# Patient Record
Sex: Female | Born: 1995 | Race: White | Hispanic: No | Marital: Single | Smoking: Never smoker
Health system: Southern US, Community
[De-identification: ages and names within clinical notes are randomized; demographics above are authoritative.]

---

## 2013-01-25 ENCOUNTER — Emergency Department (HOSPITAL_COMMUNITY): Payer: Medicaid Other

## 2013-01-25 ENCOUNTER — Encounter (HOSPITAL_COMMUNITY): Payer: Self-pay | Admitting: Emergency Medicine

## 2013-01-25 ENCOUNTER — Emergency Department (HOSPITAL_COMMUNITY)
Admission: EM | Admit: 2013-01-25 | Discharge: 2013-01-25 | Disposition: A | Discharge status: Home / Self Care | Payer: Medicaid Other | Attending: Emergency Medicine | Admitting: Emergency Medicine

## 2013-01-25 DIAGNOSIS — R42 Dizziness and giddiness: Secondary | ICD-10-CM | POA: Insufficient documentation

## 2013-01-25 DIAGNOSIS — W219XXA Striking against or struck by unspecified sports equipment, initial encounter: Secondary | ICD-10-CM | POA: Insufficient documentation

## 2013-01-25 DIAGNOSIS — Y92838 Other recreation area as the place of occurrence of the external cause: Secondary | ICD-10-CM | POA: Insufficient documentation

## 2013-01-25 DIAGNOSIS — S0510XA Contusion of eyeball and orbital tissues, unspecified eye, initial encounter: Secondary | ICD-10-CM | POA: Insufficient documentation

## 2013-01-25 DIAGNOSIS — Y9364 Activity, baseball: Secondary | ICD-10-CM | POA: Insufficient documentation

## 2013-01-25 DIAGNOSIS — S060X0A Concussion without loss of consciousness, initial encounter: Secondary | ICD-10-CM | POA: Insufficient documentation

## 2013-01-25 DIAGNOSIS — S0512XA Contusion of eyeball and orbital tissues, left eye, initial encounter: Secondary | ICD-10-CM

## 2013-01-25 DIAGNOSIS — S060X9A Concussion with loss of consciousness of unspecified duration, initial encounter: Secondary | ICD-10-CM

## 2013-01-25 DIAGNOSIS — Y9239 Other specified sports and athletic area as the place of occurrence of the external cause: Secondary | ICD-10-CM | POA: Insufficient documentation

## 2013-01-25 MED ORDER — TRAMADOL HCL 50 MG PO TABS
50.0000 mg | ORAL_TABLET | Freq: Once | ORAL | Status: AC
  Administered 2013-01-25: 50 mg via ORAL
  Filled 2013-01-25: qty 1

## 2013-01-25 MED ORDER — TRAMADOL HCL 50 MG PO TABS: 50.0000 mg | ORAL_TABLET | Freq: Four times a day (QID) | ORAL | Status: DC | PRN

## 2013-01-25 NOTE — ED Notes (Signed)
Sclera is white in appearance. Slight edema below eye. Eyelids are bruised.

## 2013-01-25 NOTE — ED Notes (Signed)
Pt states while at batting practice last night was hit by softball from another player bat. Swelling and bruising noted to L eye. Pt states vision cleared @ 1700 today, denies n/v. C/o HA

## 2013-01-25 NOTE — ED Provider Notes (Signed)
History    This chart was scribed for non-physician practitioner working with Maria Munch, MD by Burman Nieves. This patient was seen in room WTR8/WTR8 and the patient's care was started at 10:22 PM.   CSN: [id number]  Arrival date & time 01/25/13  2011  Chief Complaint  Patient presents with  . Eye Injury    (Consider location/radiation/quality/duration/timing/severity/associated sxs/prior treatment) Patient is a 17 y.o. female presenting with eye pain. The history is provided by the patient. No language interpreter was used.  Eye Pain This is a new problem. The current episode started yesterday. The problem occurs constantly. The problem has not changed since onset.Associated symptoms include headaches. Pertinent negatives include no shortness of breath. Nothing aggravates the symptoms. Nothing relieves the symptoms.   Maria Clements is a 17 y.o. female who presents to the Emergency Department complaining of moderate constant left eye pain onset yesterday evening. She got hit in left eye by softball that was traveling aprox 50 mph off of another players bat. Pt states vision is blurry accompanied with dizziness and headache.  Pt denies of fever, chills, cough, nausea, vomiting, diarrhea, SOB, weakness, and any other associated symptoms. She has taken Ibuprofen 800 mg for the pain, but does not feel that it helped.  She has also applied ice to the area, which has helped with the swelling somewhat.   History reviewed. No pertinent past medical history.  History reviewed. No pertinent past surgical history.  No family history on file.  History  Substance Use Topics  . Smoking status: Never Smoker   . Smokeless tobacco: Not on file  . Alcohol Use: No    OB History   Grav Para Term Preterm Abortions TAB SAB Ect Mult Living                  Review of Systems  Constitutional: Negative for fever and chills.  Eyes: Positive for pain and visual disturbance. Negative for photophobia.   Respiratory: Negative for shortness of breath.   Gastrointestinal: Negative for nausea and vomiting.  Neurological: Positive for dizziness and headaches. Negative for weakness.  All other systems reviewed and are negative.    Allergies  Ceclor  Home Medications   Current Outpatient Rx  Name  Route  Sig  Dispense  Refill  . ibuprofen (ADVIL,MOTRIN) 200 MG tablet   Oral   Take 800 mg by mouth every 6 (six) hours as needed for pain (for pain).           BP 92/74  Pulse 62  Temp(Src) 98 F (36.7 C) (Oral)  Resp 16  Ht 5\' 6"  (1.676 m)  Wt 165 lb (74.844 kg)  BMI 26.64 kg/m2  SpO2 100%  LMP 12/25/2012  Physical Exam  Nursing note and vitals reviewed. Constitutional: She is oriented to person, place, and time. She appears well-developed and well-nourished. No distress.  HENT:  Head: Normocephalic and atraumatic.  Eyes: Conjunctivae and EOM are normal. Pupils are equal, round, and reactive to light.  Neck: Neck supple. No tracheal deviation present.  Cardiovascular: Normal rate, regular rhythm and normal heart sounds.   Pulmonary/Chest: Effort normal and breath sounds normal. No respiratory distress.  Musculoskeletal: Normal range of motion. She exhibits no tenderness.  Neurological: She is alert and oriented to person, place, and time. She has normal strength. No cranial nerve deficit or sensory deficit. Coordination normal.  Skin: Skin is warm and dry.  Psychiatric: She has a normal mood and affect. Her behavior is normal.  ED Course  Procedures (including critical care time) DIAGNOSTIC STUDIES: Oxygen Saturation is 100% on room air, normal by my interpretation.    COORDINATION OF CARE:  10:28 PM Discussed ED treatment with pt and pt agrees.     Labs Reviewed - No data to display Ct Maxillofacial Wo Cm  01/25/2013  *RADIOLOGY REPORT*  Clinical Data: Left eye injury  CT MAXILLOFACIAL WITHOUT CONTRAST  Technique:  Multidetector CT imaging of the maxillofacial  structures was performed. Multiplanar CT image reconstructions were also generated.  Comparison: None.  Findings: No evidence of maxillofacial fracture.  Mild soft tissue swelling overlying the left lateral orbit/zygoma (series 3/image 56).  The bilateral orbits, including the globes and retroconal soft tissues, are within normal limits.  Partial opacification of the left maxillary sinuses.  The visualized paranasal sinuses and mastoid air cells are otherwise clear.  The visualized brain parenchyma is unremarkable.  The cervical spine is normal to C6.  IMPRESSION: Mild soft tissue swelling overlying the left lateral orbit/zygoma.  Underlying left lobe and retroconal soft tissues are within normal limits.  No evidence of maxillofacial fracture.   Original Report Authenticated By: Charline Bills, M.D.      No diagnosis found.    MDM  Patient hit in the eye with a softball last evening.  No LOC.  No vomiting.  She reports that vision is slightly blurred, but normal visual acuity.  Normal EOM.  CT maxillofacial negative for fracture or injury to the orbit or globe.  Patient instructed to follow up with her Athletic Trainer at school for concussion testing.    I personally performed the services described in this documentation, which was scribed in my presence. The recorded information has been reviewed and is accurate.     Pascal Lux [place], PA-C 01/26/13 1055

## 2013-01-26 NOTE — ED Provider Notes (Signed)
  Medical screening examination/treatment/procedure(s) were performed by non-physician practitioner and as supervising physician I was immediately available for consultation/collaboration.    Aaradhya Kysar, MD 01/26/13 2121 

## 2014-04-26 IMAGING — CT CT MAXILLOFACIAL W/O CM
1 series · 16 of 30 positions shown, 20 images · non-contrast
Comparison: None.

CLINICAL DATA: Left eye injury

CT MAXILLOFACIAL WITHOUT CONTRAST
TECHNIQUE: Multidetector CT imaging of the maxillofacial
structures was performed. Multiplanar CT image reconstructions were
also generated.

[Series 3: facial st · axial · 0.32mm/px · z∈[+588,+716]mm · 16 of 70 slices shown, 20 images]
[im 3/70  brain]
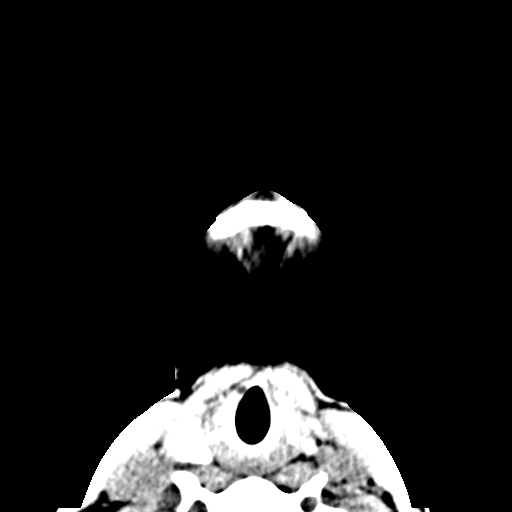
[im 3/70  bone]
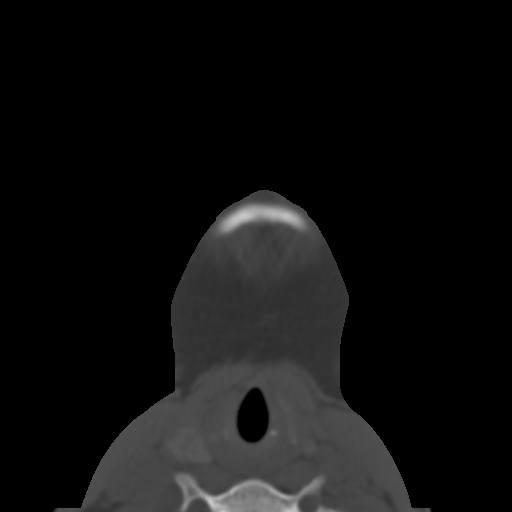
[im 8/70  bone]
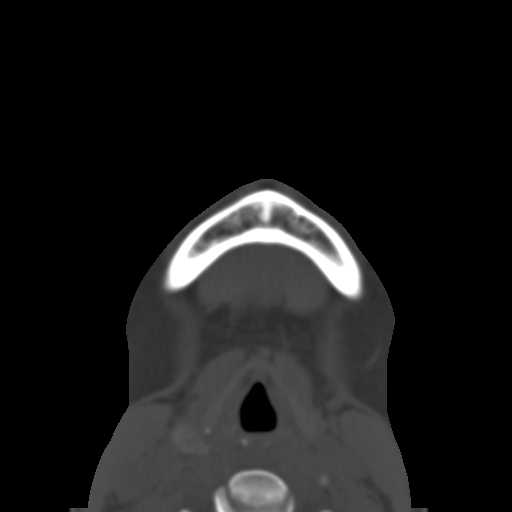
[im 12/70  bone]
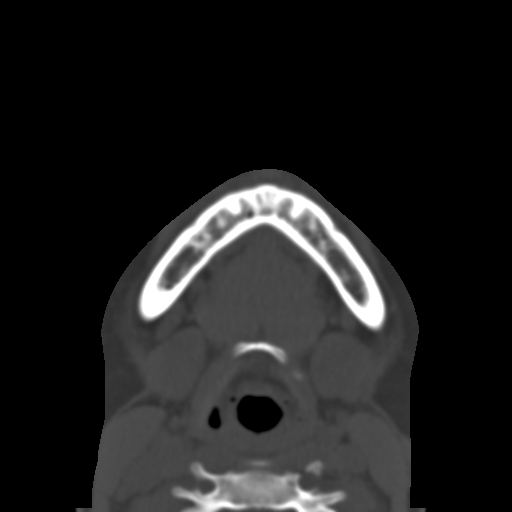
[im 17/70  bone]
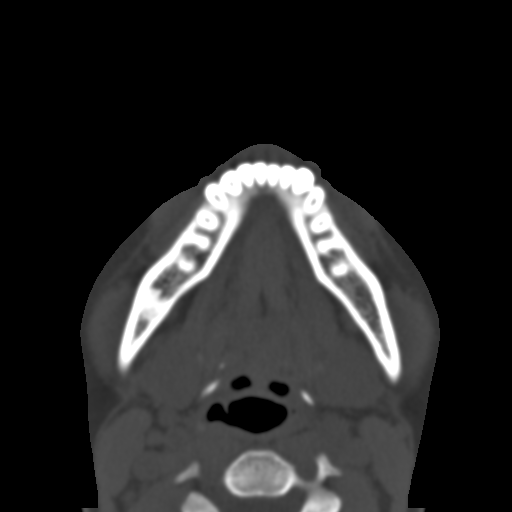
[im 20/70  brain]
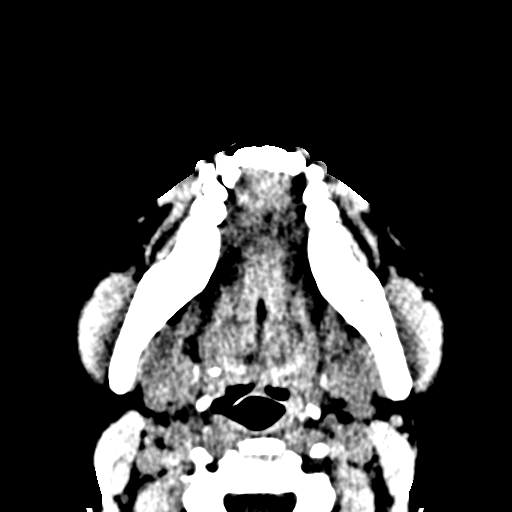
[im 20/70  bone]
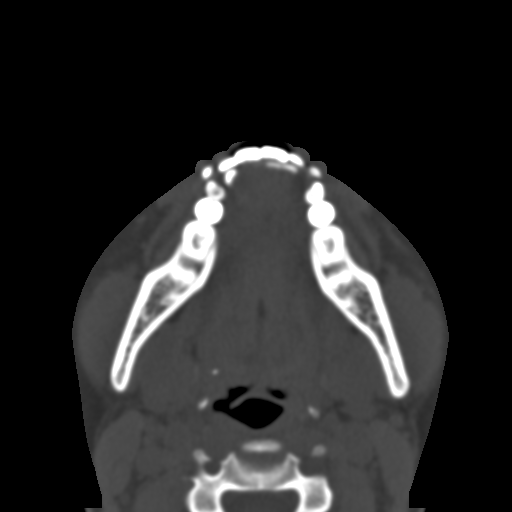
[im 24/70  bone]
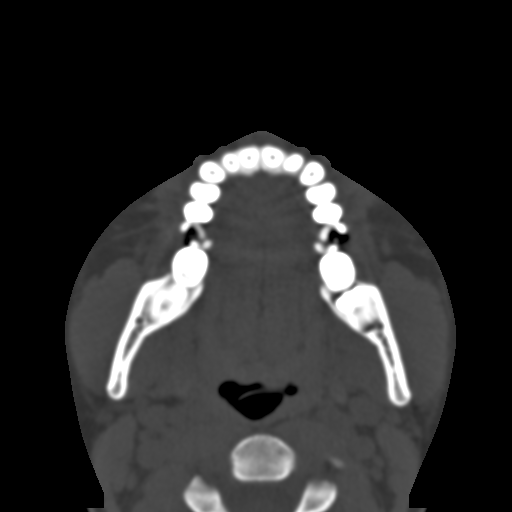
[im 29/70  bone]
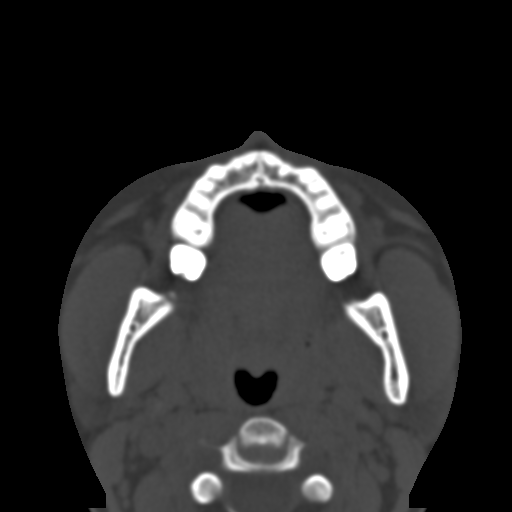
[im 34/70  bone]
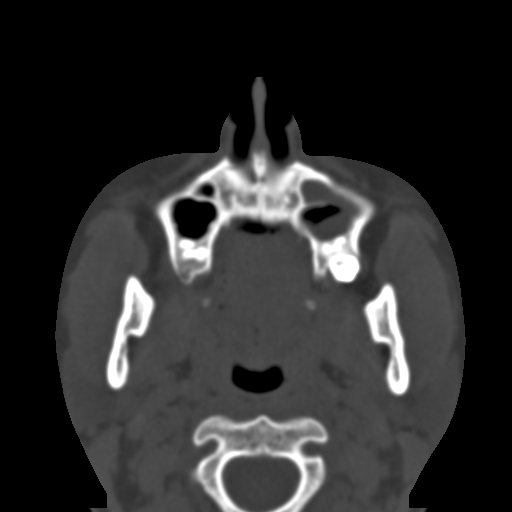
[im 36/70  brain]
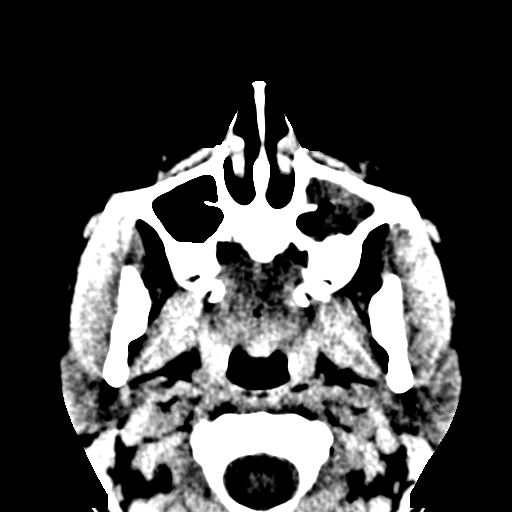
[im 36/70  bone]
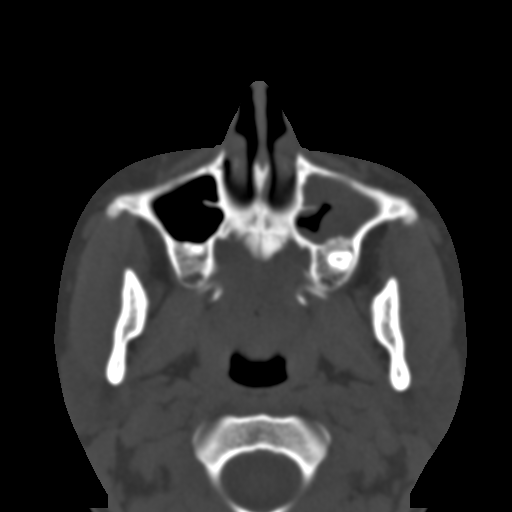
[im 41/70  bone]
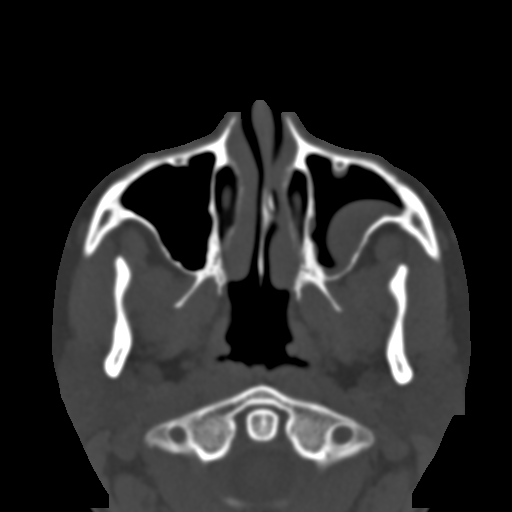
[im 46/70  bone]
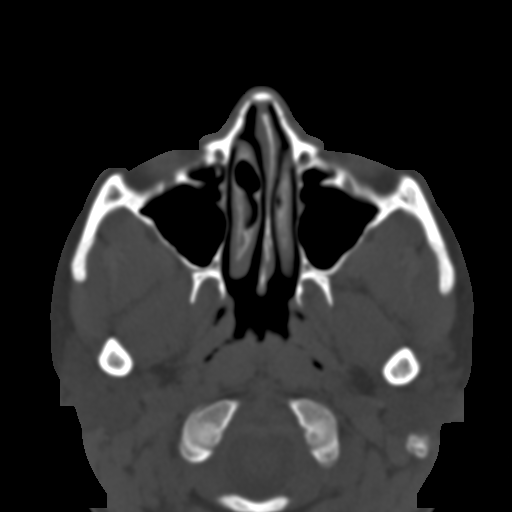
[im 50/70  bone]
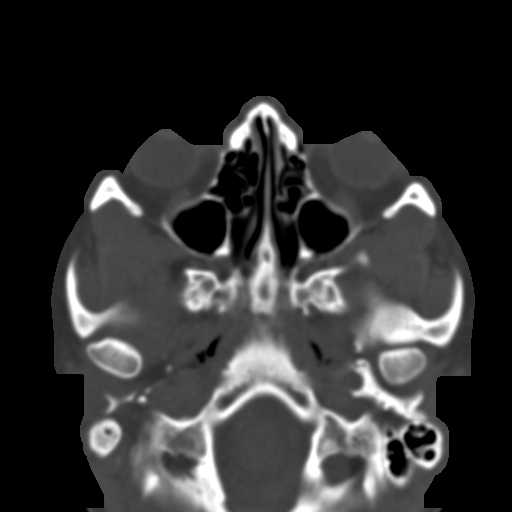
[im 53/70  brain]
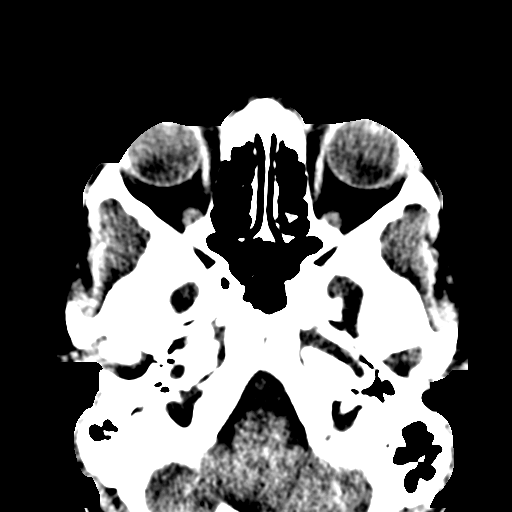
[im 53/70  bone]
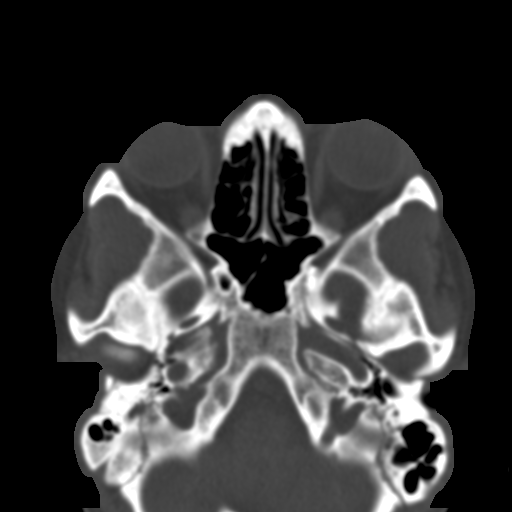
[im 58/70  bone]
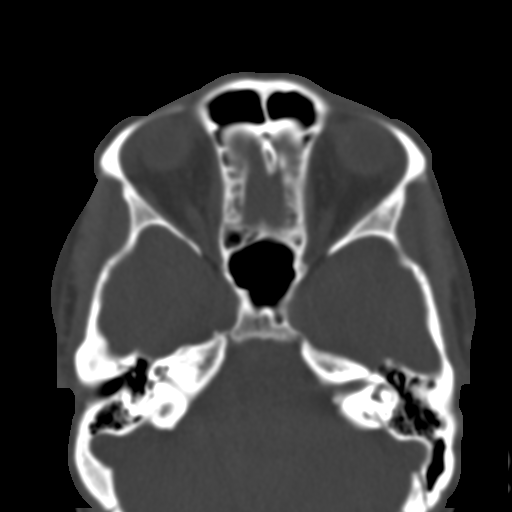
[im 62/70  bone]
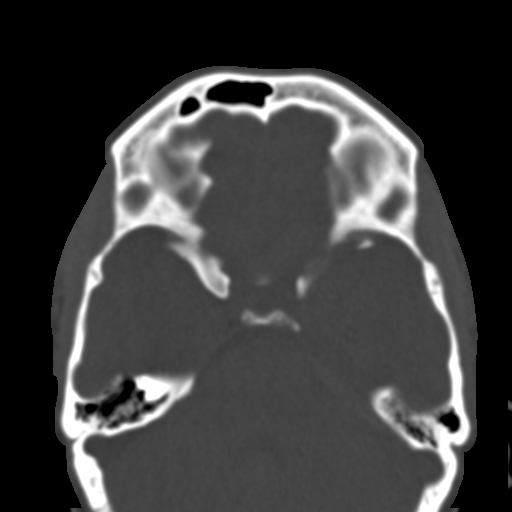
[im 67/70  bone]
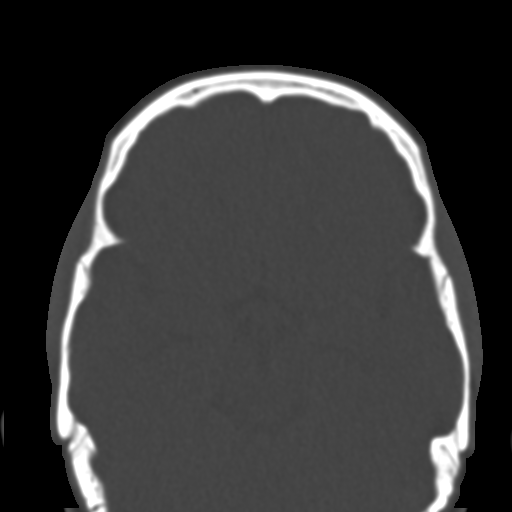

[16 of 30 positions shown; findings below may reference images not displayed]

FINDINGS: No evidence of maxillofacial fracture.

Mild soft tissue swelling overlying the left lateral orbit/zygoma
(series 3/image 56).

The bilateral orbits, including the globes and retroconal soft
tissues, are within normal limits.

Partial opacification of the left maxillary sinuses.  The
visualized paranasal sinuses and mastoid air cells are otherwise
clear.

The visualized brain parenchyma is unremarkable.

The cervical spine is normal to C6.
IMPRESSION: Mild soft tissue swelling overlying the left lateral orbit/zygoma.

Underlying left lobe and retroconal soft tissues are within normal
limits.

No evidence of maxillofacial fracture.

## 2015-01-21 ENCOUNTER — Emergency Department (HOSPITAL_COMMUNITY)
Admission: EM | Admit: 2015-01-21 | Discharge: 2015-01-22 | Disposition: A | Discharge status: Home / Self Care | Payer: PRIVATE HEALTH INSURANCE | Attending: Emergency Medicine | Admitting: Emergency Medicine

## 2015-01-21 ENCOUNTER — Emergency Department (HOSPITAL_COMMUNITY): Payer: PRIVATE HEALTH INSURANCE

## 2015-01-21 ENCOUNTER — Encounter (HOSPITAL_COMMUNITY): Payer: Self-pay | Admitting: Emergency Medicine

## 2015-01-21 DIAGNOSIS — Y9289 Other specified places as the place of occurrence of the external cause: Secondary | ICD-10-CM | POA: Insufficient documentation

## 2015-01-21 DIAGNOSIS — Y998 Other external cause status: Secondary | ICD-10-CM | POA: Insufficient documentation

## 2015-01-21 DIAGNOSIS — Y9389 Activity, other specified: Secondary | ICD-10-CM | POA: Insufficient documentation

## 2015-01-21 DIAGNOSIS — S93401A Sprain of unspecified ligament of right ankle, initial encounter: Secondary | ICD-10-CM | POA: Insufficient documentation

## 2015-01-21 DIAGNOSIS — W1839XA Other fall on same level, initial encounter: Secondary | ICD-10-CM | POA: Insufficient documentation

## 2015-01-21 NOTE — ED Notes (Signed)
Pt. lost her balance and fell last Tuesday , no LOC / ambulatory , reports pain at right ankle .

## 2015-01-22 MED ORDER — OXYCODONE-ACETAMINOPHEN 5-325 MG PO TABS
1.0000 | ORAL_TABLET | Freq: Once | ORAL | Status: AC
  Administered 2015-01-22: 1 via ORAL
  Filled 2015-01-22: qty 1

## 2015-01-22 MED ORDER — TRAMADOL HCL 50 MG PO TABS: 50.0000 mg | ORAL_TABLET | Freq: Four times a day (QID) | ORAL | Status: AC | PRN

## 2015-01-22 NOTE — Discharge Instructions (Signed)
Continue taking ibuprofen, 600-800 mg every 6-8 hours for pain and swelling. Take tramadol as directed for pain. Rest, ice and elevate your ankle.  Ankle Sprain An ankle sprain is an injury to the strong, fibrous tissues (ligaments) that hold the bones of your ankle joint together.  CAUSES An ankle sprain is usually caused by a fall or by twisting your ankle. Ankle sprains most commonly occur when you step on the outer edge of your foot, and your ankle turns inward. People who participate in sports are more prone to these types of injuries.  SYMPTOMS   Pain in your ankle. The pain may be present at rest or only when you are trying to stand or walk.  Swelling.  Bruising. Bruising may develop immediately or within 1 to 2 days after your injury.  Difficulty standing or walking, particularly when turning corners or changing directions. DIAGNOSIS  Your caregiver will ask you details about your injury and perform a physical exam of your ankle to determine if you have an ankle sprain. During the physical exam, your caregiver will press on and apply pressure to specific areas of your foot and ankle. Your caregiver will try to move your ankle in certain ways. An X-ray exam may be done to be sure a bone was not broken or a ligament did not separate from one of the bones in your ankle (avulsion fracture).  TREATMENT  Certain types of braces can help stabilize your ankle. Your caregiver can make a recommendation for this. Your caregiver may recommend the use of medicine for pain. If your sprain is severe, your caregiver may refer you to a surgeon who helps to restore function to parts of your skeletal system (orthopedist) or a physical therapist. HOME CARE INSTRUCTIONS   Apply ice to your injury for 1-2 days or as directed by your caregiver. Applying ice helps to reduce inflammation and pain.  Put ice in a plastic bag.  Place a towel between your skin and the bag.  Leave the ice on for 15-20 minutes  at a time, every 2 hours while you are awake.  Only take over-the-counter or prescription medicines for pain, discomfort, or fever as directed by your caregiver.  Elevate your injured ankle above the level of your heart as much as possible for 2-3 days.  If your caregiver recommends crutches, use them as instructed. Gradually put weight on the affected ankle. Continue to use crutches or a cane until you can walk without feeling pain in your ankle.  If you have a plaster splint, wear the splint as directed by your caregiver. Do not rest it on anything harder than a pillow for the first 24 hours. Do not put weight on it. Do not get it wet. You may take it off to take a shower or bath.  You may have been given an elastic bandage to wear around your ankle to provide support. If the elastic bandage is too tight (you have numbness or tingling in your foot or your foot becomes cold and blue), adjust the bandage to make it comfortable.  If you have an air splint, you may blow more air into it or let air out to make it more comfortable. You may take your splint off at night and before taking a shower or bath. Wiggle your toes in the splint several times per day to decrease swelling. SEEK MEDICAL CARE IF:   You have rapidly increasing bruising or swelling.  Your toes feel extremely cold or you  lose feeling in your foot.  Your pain is not relieved with medicine. SEEK IMMEDIATE MEDICAL CARE IF:  Your toes are numb or blue.  You have severe pain that is increasing. MAKE SURE YOU:   Understand these instructions.  Will watch your condition.  Will get help right away if you are not doing well or get worse. Document Released: 12/04/2005 Document Revised: 08/28/2012 Document Reviewed: 12/16/2011 [facility] Patient Information 2015 [place], [place]. This information is not intended to replace advice given to you by your health care provider. Make sure you discuss any questions you have with your health  care provider. Elastic Bandage and RICE Elastic bandages come in different shapes and sizes. They perform different functions. Your caregiver will help you to decide what is best for your protection, recovery, or rehabilitation following an injury. The following are some general tips to help you use an elastic bandage.  Use the bandage as directed by the maker of the bandage you are using.  Do not wrap it too tight. This may cut off the circulation of the arm or leg below the bandage.  If part of your body beyond the bandage becomes blue, numb, or swollen, it is too tight. Loosen the bandage as needed to prevent these problems.  See your caregiver or trainer if the bandage seems to be making your problems worse rather than better. Bandages may be a reminder to you that you have an injury. However, they provide very little support. The few pounds of support they provide are minor considering the pressure it takes to injure a joint or tear ligaments. Therefore, the joint will not be able to handle all of the wear and tear it could before the injury. The routine care of many injuries includes Rest, Ice, Compression, and Elevation (RICE).  Rest is required to allow your body to heal. Generally, routine activities can be resumed when comfortable. Injured tendons and bones take about 6 weeks to heal.  Icing the injury helps keep the swelling down and reduces pain. Do not apply ice directly to the skin. Put ice in a plastic bag. Place a towel between the skin and the bag. This will prevent frostbite to the skin. Apply ice bags to the injured area for 15-20 minutes, every 2 hours while awake. Do this for the first 24 to 48 hours, then as directed by your caregiver.  Compression helps keep swelling down, gives support, and helps with discomfort. If an elastic bandage has been applied today, it should be removed and reapplied every 3 to 4 hours. It should not be applied tightly, but firmly enough to keep  swelling down. Watch fingers or toes for swelling, bluish discoloration, coldness, numbness, or increased pain. If any of these problems occur, remove the bandage and reapply it more loosely. If these problems persist, contact your caregiver.  Elevation helps reduce swelling and decreases pain. The injured area (arms, hands, legs, or feet) should be placed near to or above the heart (center of the chest) if able. Persistent pain and inability to use the injured area for more than 2 to 3 days are warning signs. You should see a caregiver for a follow-up visit as soon as possible. Initially, a minor broken bone (hairline fracture) may not be seen on X-rays. It may take 7 to 10 days to finally show up. Continued pain and swelling show that further evaluation and/or X-rays are needed. Make a follow-up visit with your caregiver. A specialist in reading X-rays (radiologist) will  read your X-rays again. Finding out the results of your test Not all test results are available during your visit. If your test results are not back during the visit, make an appointment with your caregiver to find out the results. Do not assume everything is normal if you have not heard from your caregiver or the medical facility. It is important for you to follow up on all of your test results. Document Released: 05/26/2002 Document Revised: 02/26/2012 Document Reviewed: 04/06/2008 [facility] Patient Information 2015 [place], [place]. This information is not intended to replace advice given to you by your health care provider. Make sure you discuss any questions you have with your health care provider.

## 2015-01-22 NOTE — ED Provider Notes (Signed)
CSN: [id number]     Arrival date & time 01/21/15  2249 History   First MD Initiated Contact with Patient 01/21/15 2310     Chief Complaint  Patient presents with  . Ankle Pain     (Consider location/radiation/quality/duration/timing/severity/associated sxs/prior Treatment) HPI Comments: 19 y/o F presenting with right ankle pain x3 days. Pt reports she stepped down from her truck and twisted her ankle which has been increasingly swelling and starting to bruise. Slight tingling to her foot. Pain worse with any pressure. She has been walking on it with difficulty. Tried taking ibuprofen with minimal relief.  Patient is a 19 y.o. female presenting with ankle pain. The history is provided by the patient.  Ankle Pain   History reviewed. No pertinent past medical history. History reviewed. No pertinent past surgical history. No family history on file. History  Substance Use Topics  . Smoking status: Never Smoker   . Smokeless tobacco: Not on file  . Alcohol Use: No   OB History    No data available     Review of Systems  Musculoskeletal:       + Ankle pain and swelling.  Skin: Positive for color change.  Neurological: Negative for numbness.      Allergies  Ceclor  Home Medications   Prior to Admission medications   Medication Sig Start Date End Date Taking? Authorizing Provider  ibuprofen (ADVIL,MOTRIN) 200 MG tablet Take 800 mg by mouth every 6 (six) hours as needed for pain (for pain).    Historical Provider, MD  traMADol (ULTRAM) 50 MG tablet Take 1 tablet (50 mg total) by mouth every 6 (six) hours as needed. 01/22/15   Devan Babino M Verla Bryngelson, PA-C   BP 120/73 mmHg  Pulse 96  Temp(Src) 97.8 F (36.6 C) (Oral)  Resp 18  SpO2 98%  LMP 12/23/2014 Physical Exam  Constitutional: She is oriented to person, place, and time. She appears well-developed and well-nourished. No distress.  HENT:  Head: Normocephalic and atraumatic.  Mouth/Throat: Oropharynx is clear and moist.  Eyes:  Conjunctivae and EOM are normal.  Neck: Normal range of motion. Neck supple.  Cardiovascular: Normal rate, regular rhythm and normal heart sounds.   Pulmonary/Chest: Effort normal and breath sounds normal. No respiratory distress.  Musculoskeletal:  R ankle TTP throughout, most prominent over AITFL and PTFL. ROM limited by pain. Swelling laterally with ecchymosis. No deformity. +2 PT/DP pulse. Cap refill < 3 seconds. Able to wiggle toes. No tenderness at proximal fibula.  Neurological: She is alert and oriented to person, place, and time. No sensory deficit.  Skin: Skin is warm and dry.  Psychiatric: She has a normal mood and affect. Her behavior is normal.  Nursing note and vitals reviewed.   ED Course  Procedures (including critical care time) Labs Review Labs Reviewed - No data to display  Imaging Review Dg Ankle Complete Right  01/22/2015   CLINICAL DATA:  Rolled right ankle while getting out of car, with right ankle pain and swelling. Initial encounter.  EXAM: RIGHT ANKLE - COMPLETE 3+ VIEW  COMPARISON:  None.  FINDINGS: There is no evidence of fracture or dislocation. The ankle mortise is intact; the interosseous space is within normal limits. No talar tilt or subluxation is seen. An os trigonum is noted.  The joint spaces are preserved. Mild diffuse soft tissue swelling is noted about the ankle. Mild edema is seen within Kager's fat pad.  IMPRESSION: 1. No evidence of fracture or dislocation. 2. Os trigonum noted.  Electronically Signed   By: Roanna RaiderJeffery  Chang M.D.   On: 01/22/2015 00:21     EKG Interpretation None      MDM   Final diagnoses:  Right ankle sprain, initial encounter   Pt in NAD. Neurovascularly intact. Xray without acute finding. ACE wrap applied. Crutches given. RICE, NSAIDs. Stable for d/c. Return precautions given. Patient states understanding of treatment care plan and is agreeable.   Kathrynn SpeedRobyn M Loui [place], PA-C 01/22/15 [id number]  Gerhard Munchobert Lockwood, MD 01/22/15 [phone]

## 2016-04-21 IMAGING — DX DG ANKLE COMPLETE 3+V*R*
3 series · 3 of 3 positions shown · non-contrast
Comparison: None.

CLINICAL DATA: Rolled right ankle while getting out of car, with
right ankle pain and swelling. Initial encounter.

EXAM:
RIGHT ANKLE - COMPLETE 3+ VIEW

[ankle ap]
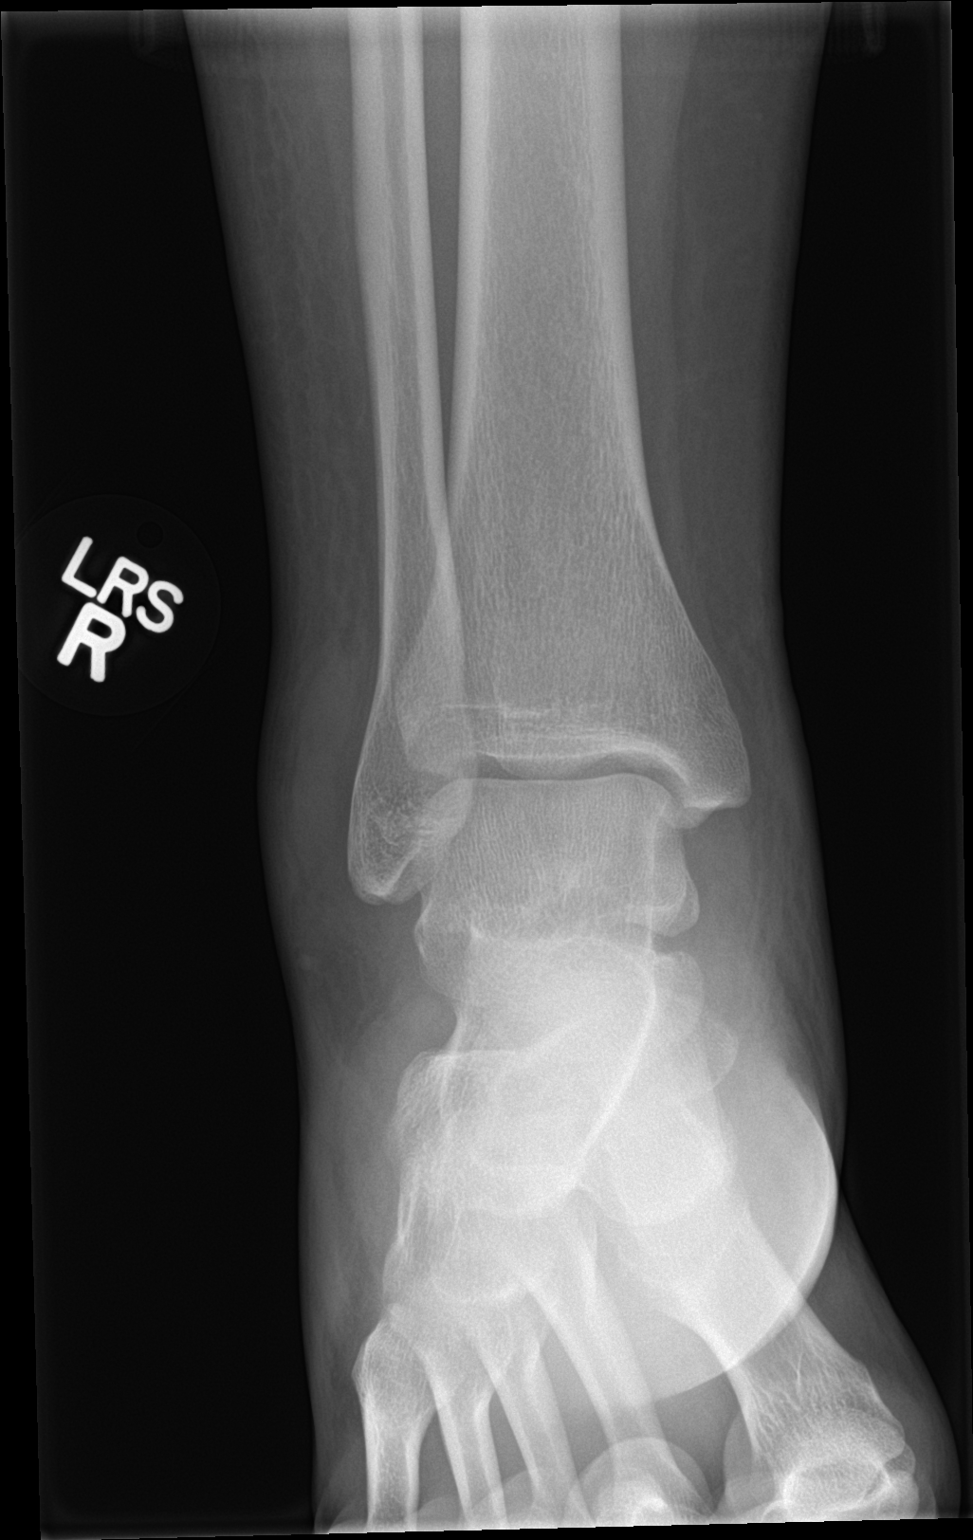

[ankle obl]
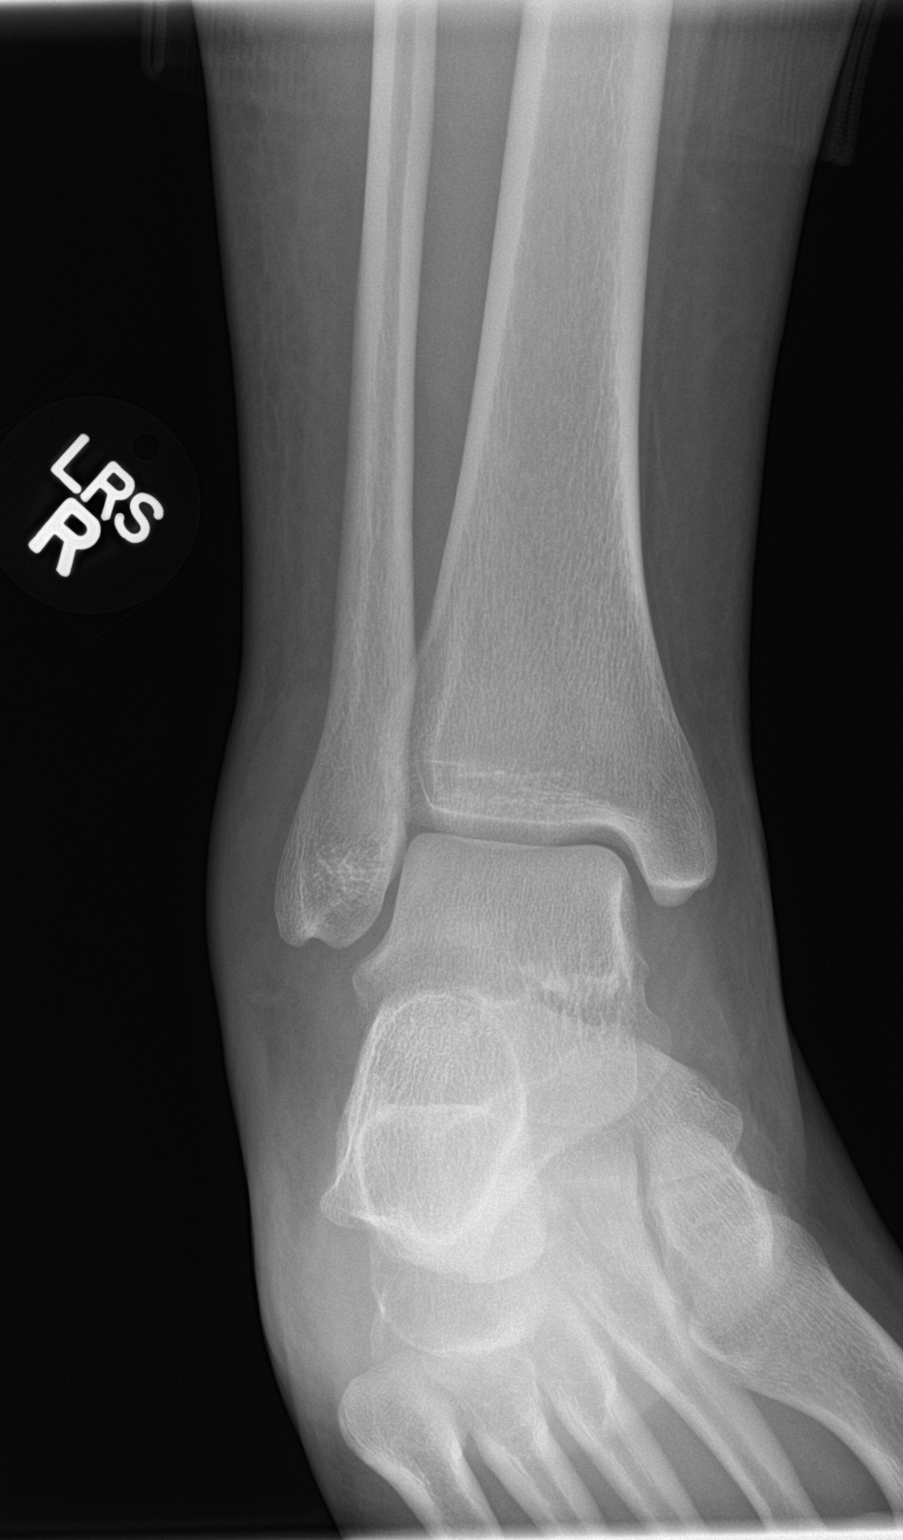

[ankle lat]
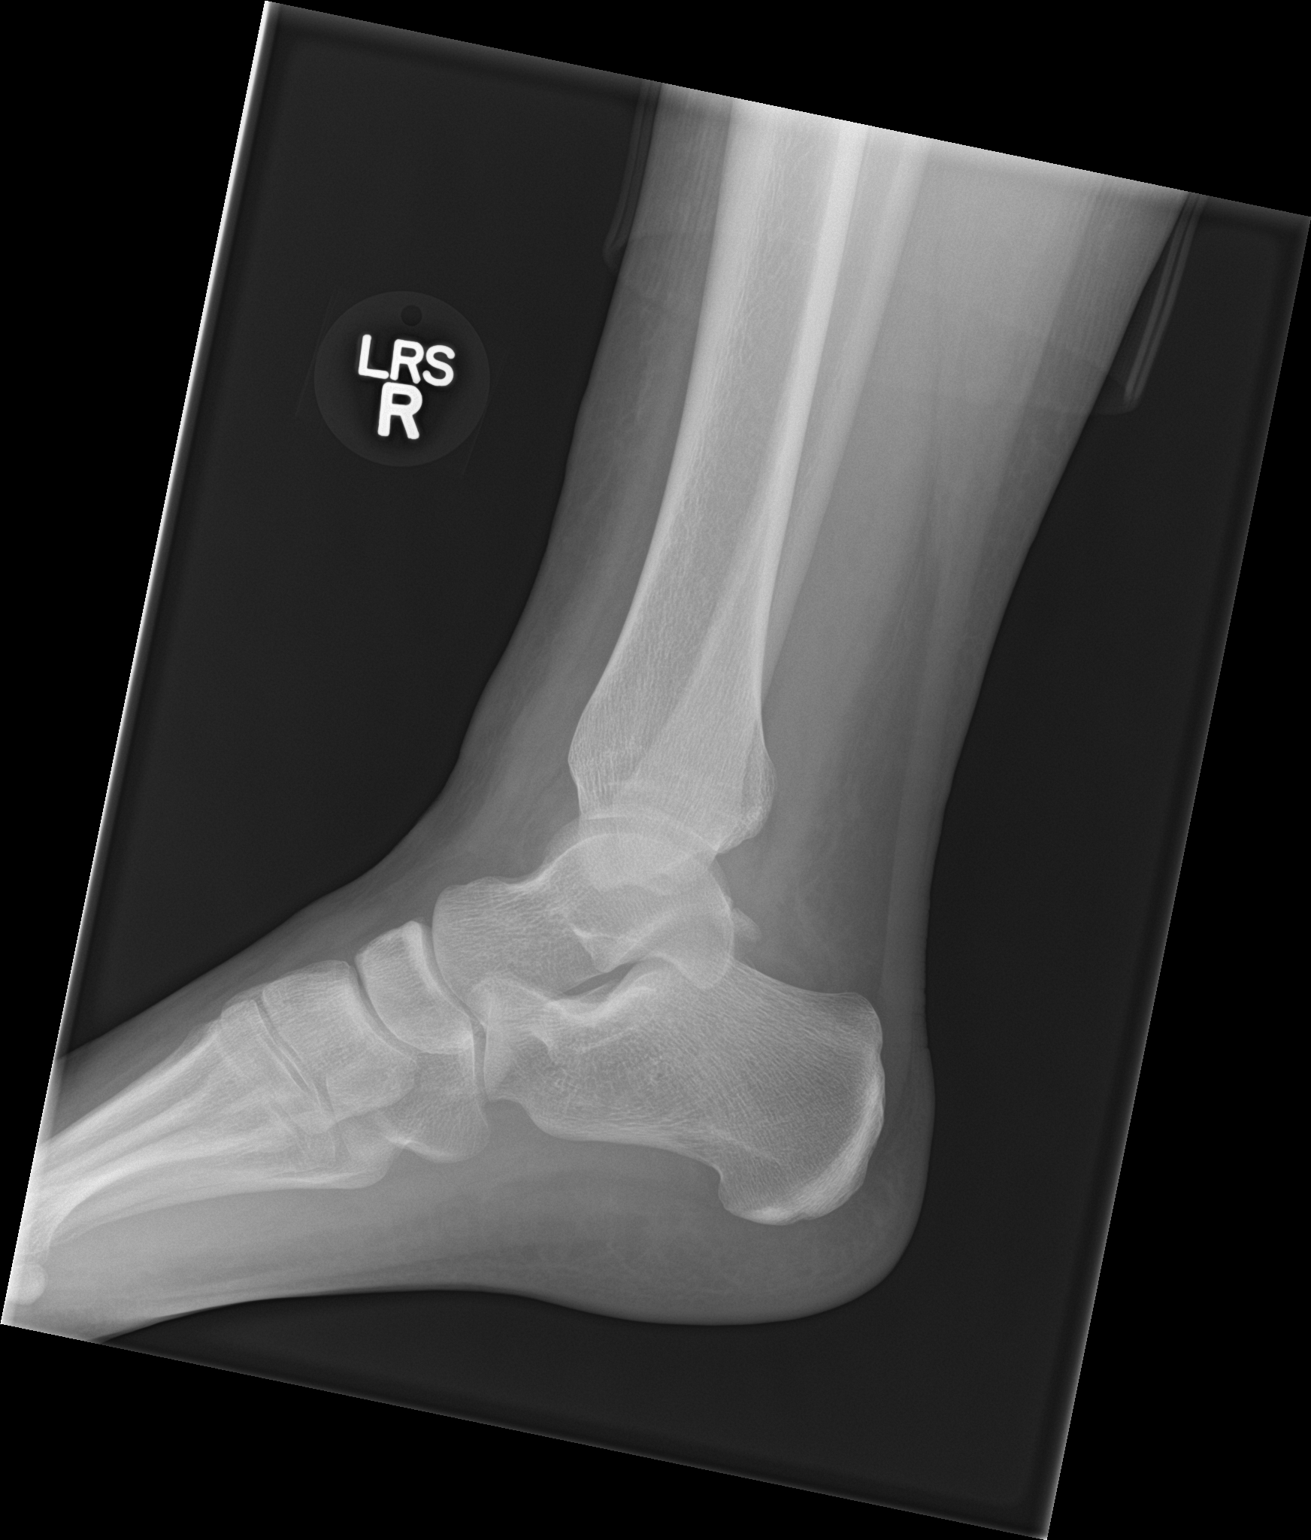

[3 of 3 positions shown; findings below may reference images not displayed]

FINDINGS: There is no evidence of fracture or dislocation. The ankle mortise
is intact; the interosseous space is within normal limits. No talar
tilt or subluxation is seen. An os trigonum is noted.

The joint spaces are preserved. Mild diffuse soft tissue swelling is
noted about the ankle. Mild edema is seen within Kager's fat pad.
IMPRESSION: 1. No evidence of fracture or dislocation.
2. Os trigonum noted.
